# Patient Record
Sex: Male | Born: 1995 | Race: Black or African American | Hispanic: No | Marital: Single | Smoking: Never smoker
Health system: Southern US, Community
[De-identification: ages and names within clinical notes are randomized; demographics above are authoritative.]

## PROBLEM LIST (undated history)

## (undated) DIAGNOSIS — K219 Gastro-esophageal reflux disease without esophagitis: Secondary | ICD-10-CM

## (undated) DIAGNOSIS — T7840XA Allergy, unspecified, initial encounter: Secondary | ICD-10-CM

## (undated) DIAGNOSIS — S42302A Unspecified fracture of shaft of humerus, left arm, initial encounter for closed fracture: Secondary | ICD-10-CM

## (undated) HISTORY — DX: Allergy, unspecified, initial encounter: T78.40XA

## (undated) HISTORY — DX: Unspecified fracture of shaft of humerus, left arm, initial encounter for closed fracture: S42.302A

## (undated) HISTORY — DX: Gastro-esophageal reflux disease without esophagitis: K21.9

---
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
